# Patient Record
Sex: Female | Born: 1985 | Race: Black or African American | Hispanic: No | Marital: Single | Smoking: Never smoker
Health system: Southern US, Community
[De-identification: ages and names within clinical notes are randomized; demographics above are authoritative.]

---

## 2008-05-04 ENCOUNTER — Emergency Department (HOSPITAL_COMMUNITY): Admission: EM | Admit: 2008-05-04 | Discharge: 2008-05-04 | Payer: Self-pay | Admitting: Emergency Medicine

## 2008-10-09 ENCOUNTER — Inpatient Hospital Stay (HOSPITAL_COMMUNITY): Admission: AD | Admit: 2008-10-09 | Discharge: 2008-10-11 | Payer: Self-pay | Admitting: Obstetrics and Gynecology

## 2010-06-23 LAB — CBC
HCT: 37.2 % (ref 36.0–46.0)
HCT: 39.5 % (ref 36.0–46.0)
Hemoglobin: 13.4 g/dL (ref 12.0–15.0)
MCHC: 33.8 g/dL (ref 30.0–36.0)
MCHC: 34 g/dL (ref 30.0–36.0)
MCV: 98 fL (ref 78.0–100.0)
MCV: 98.4 fL (ref 78.0–100.0)
Platelets: 182 10*3/uL (ref 150–400)
RBC: 4.03 MIL/uL (ref 3.87–5.11)
RDW: 13.9 % (ref 11.5–15.5)
RDW: 14 % (ref 11.5–15.5)
WBC: 13.4 10*3/uL — ABNORMAL HIGH (ref 4.0–10.5)

## 2010-07-30 NOTE — Discharge Summary (Signed)
NAMEMONSERRATE, BLASCHKE            ACCOUNT NO.:  [id number]   MEDICAL RECORD NO.:  [id number]          PATIENT TYPE:  INP   LOCATION:  9128                          FACILITY:  [facility]   PHYSICIAN:  Malachi Pro. Ambrose Mantle, M.D. DATE OF BIRTH:  [birth year]   DATE OF ADMISSION:  10/09/2008  DATE OF DISCHARGE:  10/11/2008                               DISCHARGE SUMMARY   HISTORY:  This is a 25 year old black female para 0, gravida 1 at 31  weeks, EDC on October 17, 2008, by last period compatible with a 12-week  ultrasound, presented to labor and delivered approximately 2 a.m. with  contractions every 2-4 minutes.  There were painful.  Cervix was  reported by the RN, 2 cm, 70%, and in the office had been a fingertip  and 30%, demonstrated change.  The patient was admitted overnight,  really had no cervical change after admission.  On Dr. Berenda Morale exam  at 8:30 a.m., the cervix was 2 cm, 50%, vertex at a -2.  The blood group  and type was O positive, negative antibody, rubella immune, hepatitis B  surface antigen negative, HIV negative, RPR nonreactive, GC and  chlamydia negative.  First trimester screen normal.  One-hour Glucola  72.  Group B strep was positive.  The patient had no known allergies.   No abnormal GYN history, surgical history, or medical history.   MEDICATIONS:  Prenatal vitamins and iron.   On admission, she was afebrile with normal vital signs.  The heart and  lungs were normal.  The abdomen was soft.  Cervix was 2 cm, 50%, vertex  at -2.  Dr. Senaida Ores ruptured membranes with clear fluid.  The patient  was placed on penicillin for prophylaxis.  Group B strep.  At 12:30  p.m., her Pitocin was at 8 milliunits a minute.  Contractions every 2-3  minutes.  Cervix was 3-4 cm, 70-80%, vertex at a -2.  By 1:43 p.m., the  Pitocin was at 10 milliunits a minute.  Contractions every 2 minutes.  Cervix was 4 cm, 90% vertex had descended to a 0 station.  At 5:45 p.m.,  Pitocin was 11  milliunits a minute.  Contractions every 2-3 minutes.  Cervix was 10 cm and vertex at a +1.  The patient pushed well and made  slow, but steady progress.  She delivered spontaneously LOA over an  intact perineum with bilateral labial and periclitoral lacerations by  Dr. Ambrose Mantle.  A living 8 pounds 4 ounces female infant without Apgars of  9 at 1 and 9 at 5 minutes.  Placenta was intact.  The uterus was normal.  Blood loss about 400 mL both labial lacerations were bleeding and  repaired with 3-0 Vicryl.  Clitoral laceration was hemostatic and not  repaired.  Postpartum, the patient did quite well and was discharged on  the second postpartum day.  RPR was nonreactive.  Initial hemoglobin  13.4, hematocrit 39.5, white count 78,100, and platelet count 201,000.  Followup hemoglobin 12.6.   FINAL DIAGNOSES:  Intrauterine pregnancy at 39 weeks and delivered left  occipitoanterior.   OPERATION:  Spontaneous delivery, left occipitoanterior  repair of  bilateral labial lacerations.   FINAL CONDITION:  Improved.   INSTRUCTIONS:  Our discharge instruction booklet.  Percocet 5/325 and  Motrin 600 mg 20 tablets of each.  Percocet one every 4-6 hours as  needed for pain and Motrin 600 mg one every 6 hours as needed for pain.  The patient is advised to return in 6 weeks for followup examination.      Malachi Pro. Ambrose Mantle, M.D.  Electronically Signed     TFH/MEDQ  D:  10/11/2008  T:  10/12/2008  Job:  [id number]

## 2010-10-24 ENCOUNTER — Other Ambulatory Visit: Payer: Self-pay | Admitting: Occupational Medicine

## 2010-10-24 ENCOUNTER — Ambulatory Visit: Payer: Self-pay

## 2010-10-24 DIAGNOSIS — Z021 Encounter for pre-employment examination: Secondary | ICD-10-CM

## 2012-09-07 ENCOUNTER — Ambulatory Visit: Payer: BC Managed Care – PPO | Admitting: Family Medicine

## 2012-09-07 VITALS — BP 120/81 | HR 83 | Temp 98.0°F | Resp 17 | Ht 70.0 in | Wt 140.0 lb

## 2012-09-07 DIAGNOSIS — M7989 Other specified soft tissue disorders: Secondary | ICD-10-CM

## 2012-09-07 LAB — POCT CBC
Granulocyte percent: 72.5 %G (ref 37–80)
Hemoglobin: 13.7 g/dL (ref 12.2–16.2)
MCV: 97.3 fL — AB (ref 80–97)
MID (cbc): 0.5 (ref 0–0.9)
Platelet Count, POC: 290 10*3/uL (ref 142–424)
RBC: 4.42 M/uL (ref 4.04–5.48)
WBC: 7.4 10*3/uL (ref 4.6–10.2)

## 2012-09-07 MED ORDER — PREDNISONE 20 MG PO TABS: ORAL_TABLET | ORAL | Status: DC

## 2012-09-07 MED ORDER — DOXYCYCLINE HYCLATE 100 MG PO TABS: 100.0000 mg | ORAL_TABLET | Freq: Two times a day (BID) | ORAL | Status: DC

## 2012-09-07 NOTE — Progress Notes (Signed)
[facility] [address] [place] [address] [phone]  Date:  09/07/2012   Name:  Deanna Burgess   DOB:  Mar 20, 1985   MRN:  [id number]  PCP:  No primary provider on file.    Chief Complaint: Arm Injury   History of Present Illness:  Deanna Burgess is a 27 y.o. very pleasant female patient who presents with the following:  She notes a problem with her right forearm- yesterday morning she noted onset of swelling, itching and burning of her right forearm.  She was not aware of any definite insect bite. Does not think she was stung by a bee or anything else.  The swelling seems to be worse today- progressed a little bit overnight No contact with new chemicals or products recently.    No other rashes or hives. Her hand is "a little numb," but she can still feel her fingers.    Otherwise she feels well, no fever or chills, no lip/ tongue swelling, SOB or wheezing.    She is generally healthy, is on OCP.  Non- smoker, has a daughter  There are no active problems to display for this patient.   History reviewed. No pertinent past medical history.  History reviewed. No pertinent past surgical history.  History  Substance Use Topics  . Smoking status: Never Smoker   . Smokeless tobacco: Not on file  . Alcohol Use: No    Family History  Problem Relation Age of Onset  . Hypertension Mother     No Known Allergies  Medication list has been reviewed and updated.  No current outpatient prescriptions on file prior to visit.   No current facility-administered medications on file prior to visit.    Review of Systems:  As per HPI- otherwise negative.   Physical Examination: Filed Vitals:   09/07/12 1128  BP: 120/81  Pulse: 83  Temp: 98 F (36.7 C)  Resp: 17   Filed Vitals:   09/07/12 1128  Height: 5\' 10"  (1.778 m)  Weight: 140 lb (63.504 kg)   Body mass index is 20.09 kg/(m^2). Ideal Body Weight: Weight in (lb) to have BMI =  25: 173.9  GEN: WDWN, NAD, Non-toxic, A & O x 3, looks well HEENT: Atraumatic, Normocephalic. Neck supple. No masses, No LAD.  Bilateral TM wnl, oropharynx normal.  PEERL,EOMI.   No angioedema Ears and Nose: No external deformity. CV: RRR, No M/G/R. No JVD. No thrill. No extra heart sounds. PULM: CTA B, no wheezes, crackles, rhonchi. No retractions. No resp. distress. No accessory muscle use. EXTR: No c/c/e NEURO Normal gait.  PSYCH: Normally interactive. Conversant. Not depressed or anxious appearing.  Calm demeanor.  Right forearm: on the ventral and ulnar side there is a large, confluent, warm and slighlty edematous area with a small central leison.   Her wrist, hand and elbow are all negative.  The swollen area is not tender or painful- it is itchy.   No pain with passive movement of the fingers.  Normal radial pulse, normal cap refill of fingers, normal strength of bilateral UE.  She is able to feel light touch on her hand   Outlined the area with a skin marker.    Results for orders placed in visit on 09/07/12  POCT CBC      Result Value Range   WBC 7.4  4.6 - 10.2 K/uL   Lymph, poc 1.5  0.6 - 3.4   POC LYMPH PERCENT 20.7  10 -  50 %L   MID (cbc) 0.5  0 - 0.9   POC MID % 6.8  0 - 12 %M   POC Granulocyte 5.4  2 - 6.9   Granulocyte percent 72.5  37 - 80 %G   RBC 4.42  4.04 - 5.48 M/uL   Hemoglobin 13.7  12.2 - 16.2 g/dL   HCT, POC 16.1  09.6 - 47.9 %   MCV 97.3 (*) 80 - 97 fL   MCH, POC 31.0  27 - 31.2 pg   MCHC 31.9  31.8 - 35.4 g/dL   RDW, POC 04.5     Platelet Count, POC 290  142 - 424 K/uL   MPV 10.1  0 - 99.8 fL    Assessment and Plan: Swelling of arm - Plan: POCT CBC, doxycycline (VIBRA-TABS) 100 MG tablet, predniSONE (DELTASONE) 20 MG tablet  Arm swelling- consider allergic reaction vs cellulitis.  Will cover for both with doxy and prednisone. She is to follow- up closely if not better in the next 1 or 2 days.  Discussed signs and sx of compartment syndrome- of  these occur she is to seek care right away.   Note for work today.   Signed Abbe Amsterdam, MD

## 2012-09-07 NOTE — Patient Instructions (Addendum)
Use the prednisone and antibiotic as directed.  You can also use benadryl, Claritin, and benadryl and/ or steroid creams.  Let me know if you are not better in the next couple of days. If any severe pain or swelling get help right away.

## 2012-09-20 ENCOUNTER — Other Ambulatory Visit: Payer: Self-pay | Admitting: Family Medicine

## 2012-09-20 ENCOUNTER — Telehealth: Payer: Self-pay

## 2012-09-20 NOTE — Telephone Encounter (Signed)
Pt is calling to see about getting an antibotic because what she was seen for a couple of weeks ago has came back Call back number is [phone]

## 2012-09-22 NOTE — Telephone Encounter (Signed)
Arm swelling- consider allergic reaction vs cellulitis. Will cover for both with doxy and prednisone. She is to follow- up closely if not better in the next 1 or 2 days. Discussed signs and sx of compartment syndrome- of these occur she is to seek care right away.  Above from office visit. Called patient advised to return to clinic. She agrees to return to clinic.

## 2015-08-27 ENCOUNTER — Emergency Department (HOSPITAL_COMMUNITY): Payer: Managed Care, Other (non HMO)

## 2015-08-27 ENCOUNTER — Emergency Department (HOSPITAL_COMMUNITY)
Admission: EM | Admit: 2015-08-27 | Discharge: 2015-08-27 | Disposition: A | Discharge status: Home / Self Care | Payer: Managed Care, Other (non HMO) | Attending: Emergency Medicine | Admitting: Emergency Medicine

## 2015-08-27 ENCOUNTER — Encounter (HOSPITAL_COMMUNITY): Payer: Self-pay | Admitting: *Deleted

## 2015-08-27 DIAGNOSIS — R072 Precordial pain: Secondary | ICD-10-CM | POA: Diagnosis present

## 2015-08-27 DIAGNOSIS — Z79899 Other long term (current) drug therapy: Secondary | ICD-10-CM | POA: Insufficient documentation

## 2015-08-27 DIAGNOSIS — R0789 Other chest pain: Secondary | ICD-10-CM | POA: Diagnosis not present

## 2015-08-27 LAB — URINE MICROSCOPIC-ADD ON

## 2015-08-27 LAB — BASIC METABOLIC PANEL
ANION GAP: 7 (ref 5–15)
BUN: 6 mg/dL (ref 6–20)
CO2: 24 mmol/L (ref 22–32)
Calcium: 9.2 mg/dL (ref 8.9–10.3)
Chloride: 107 mmol/L (ref 101–111)
Creatinine, Ser: 0.94 mg/dL (ref 0.44–1.00)
GFR calc Af Amer: >60 mL/min (ref >60)
GFR calc non Af Amer: >60 mL/min (ref >60)
GLUCOSE: 89 mg/dL (ref 65–99)
POTASSIUM: 3.5 mmol/L (ref 3.5–5.1)
Sodium: 138 mmol/L (ref 135–145)

## 2015-08-27 LAB — URINALYSIS, ROUTINE W REFLEX MICROSCOPIC
Bilirubin Urine: NEGATIVE
Glucose, UA: NEGATIVE mg/dL
Hgb urine dipstick: NEGATIVE
KETONES UR: NEGATIVE mg/dL
NITRITE: NEGATIVE
PH: 6 (ref 5.0–8.0)
Protein, ur: NEGATIVE mg/dL
SPECIFIC GRAVITY, URINE: 1.019 (ref 1.005–1.030)

## 2015-08-27 LAB — CBC
HEMATOCRIT: 41.3 % (ref 36.0–46.0)
HEMOGLOBIN: 13.3 g/dL (ref 12.0–15.0)
MCH: 29.9 pg (ref 26.0–34.0)
MCHC: 32.2 g/dL (ref 30.0–36.0)
MCV: 92.8 fL (ref 78.0–100.0)
Platelets: 263 10*3/uL (ref 150–400)
RBC: 4.45 MIL/uL (ref 3.87–5.11)
RDW: 13.3 % (ref 11.5–15.5)
WBC: 6 10*3/uL (ref 4.0–10.5)

## 2015-08-27 LAB — D-DIMER, QUANTITATIVE: D-Dimer, Quant: 0.54 ug/mL-FEU — ABNORMAL HIGH (ref 0.00–0.50)

## 2015-08-27 LAB — PREGNANCY, URINE: Preg Test, Ur: NEGATIVE

## 2015-08-27 LAB — I-STAT TROPONIN, ED: Troponin i, poc: 0 ng/mL (ref 0.00–0.08)

## 2015-08-27 MED ORDER — IBUPROFEN 400 MG PO TABS
400.0000 mg | ORAL_TABLET | Freq: Once | ORAL | Status: AC
  Administered 2015-08-27: 400 mg via ORAL
  Filled 2015-08-27: qty 1

## 2015-08-27 MED ORDER — ACETAMINOPHEN 500 MG PO TABS
1000.0000 mg | ORAL_TABLET | Freq: Once | ORAL | Status: AC
  Administered 2015-08-27: 1000 mg via ORAL
  Filled 2015-08-27: qty 2

## 2015-08-27 MED ORDER — METHOCARBAMOL 500 MG PO TABS: 1000.0000 mg | ORAL_TABLET | Freq: Four times a day (QID) | ORAL | Status: DC | PRN

## 2015-08-27 MED ORDER — IOPAMIDOL (ISOVUE-370) INJECTION 76%
100.0000 mL | Freq: Once | INTRAVENOUS | Status: AC | PRN
  Administered 2015-08-27: 100 mL via INTRAVENOUS

## 2015-08-27 MED ORDER — NAPROXEN 250 MG PO TABS: 250.0000 mg | ORAL_TABLET | Freq: Two times a day (BID) | ORAL | Status: DC | PRN

## 2015-08-27 MED ORDER — IOPAMIDOL (ISOVUE-370) INJECTION 76%
INTRAVENOUS | Status: AC
  Filled 2015-08-27: qty 100

## 2015-08-27 NOTE — ED Notes (Signed)
Pt ambulates independently and with steady gait at time of discharge. Discharge instructions and follow up information reviewed with patient. No other questions or concerns voiced at this time.  

## 2015-08-27 NOTE — Discharge Instructions (Signed)
Take the prescriptions as directed.  Apply moist heat or ice to the area(s) of discomfort, for 15 minutes at a time, several times per day for the next few days.  Do not fall asleep on a heating or ice pack.  Call your regular medical doctor today to schedule a follow up appointment this week.  Return to the Emergency Department immediately if worsening. ° °

## 2015-08-27 NOTE — ED Provider Notes (Signed)
CSN: [id number]     Arrival date & time 08/27/15  0841 History   First MD Initiated Contact with Patient 08/27/15 0915     Chief Complaint  Patient presents with  . Chest Pain      HPI Pt was seen at 0915. Per pt, c/o gradual onset and persistence of constant chest "pain" that began last night. Pt states the CP started on her upper mid-sternal area, then moved to her right chest, then moved back to her mid-sternal area. States she has had similar symptoms intermittently "for years," but did not seek medical attention. Pt did not take any meds to treat her symptoms. Denies palpitations, no SOB/cough, no abd pain, no N/V/D, no back pain, no neck pain, no rash, no fevers.    History reviewed. No pertinent past medical history.  Family History  Problem Relation Age of Onset  . Hypertension Mother    Social History  Substance Use Topics  . Smoking status: Never Smoker   . Smokeless tobacco: None  . Alcohol Use: Yes     Comment: occ    Review of Systems ROS: Statement: All systems negative except as marked or noted in the HPI; Constitutional: Negative for fever and chills. ; ; Eyes: Negative for eye pain, redness and discharge. ; ; ENMT: Negative for ear pain, hoarseness, nasal congestion, sinus pressure and sore throat. ; ; Cardiovascular: Negative for palpitations, diaphoresis, dyspnea and peripheral edema. ; ; Respiratory: Negative for cough, wheezing and stridor. ; ; Gastrointestinal: Negative for nausea, vomiting, diarrhea, abdominal pain, blood in stool, hematemesis, jaundice and rectal bleeding. . ; ; Genitourinary: Negative for dysuria, flank pain and hematuria. ; ; Musculoskeletal: +chest wall pain. Negative for back pain and neck pain. Negative for swelling and trauma.; ; Skin: Negative for pruritus, rash, abrasions, blisters, bruising and skin lesion.; ; Neuro: Negative for headache, lightheadedness and neck stiffness. Negative for weakness, altered level of consciousness, altered  mental status, extremity weakness, paresthesias, involuntary movement, seizure and syncope.      Allergies  Review of patient's allergies indicates no known allergies.  Home Medications   Prior to Admission medications   Medication Sig Start Date End Date Taking? Authorizing Provider  doxycycline (VIBRA-TABS) 100 MG tablet Take 1 tablet (100 mg total) by mouth 2 (two) times daily. 09/07/12   Pearline Cables, MD  predniSONE (DELTASONE) 20 MG tablet Take 2 pills a day for 2 days, then 1 pill a day for 2 days 09/07/12   Pearline Cables, MD   BP 125/89 mmHg  Pulse 72  Temp(Src) 99 F (37.2 C) (Oral)  Resp 16  Ht  (1.753 m)  Wt 158 lb (71.668 kg)  BMI 23.32 kg/m2  SpO2 100%  LMP 08/24/2015 Physical Exam  0920: Physical examination:  Nursing notes reviewed; Vital signs and O2 SAT reviewed;  Constitutional: Well developed, Well nourished, Well hydrated, In no acute distress; Head:  Normocephalic, atraumatic; Eyes: EOMI, PERRL, No scleral icterus; ENMT: Mouth and pharynx normal, Mucous membranes moist; Neck: Supple, Full range of motion, No lymphadenopathy; Cardiovascular: Regular rate and rhythm, No murmur, rub, or gallop; Respiratory: Breath sounds clear & equal bilaterally, No rales, rhonchi, wheezes.  Speaking full sentences with ease, Normal respiratory effort/excursion; Chest: +right upper and mid-parasternal areas tender to palp. No rash, no deformity, no soft tissue crepitus. Movement normal; Abdomen: Soft, Nontender, Nondistended, Normal bowel sounds; Genitourinary: No CVA tenderness; Extremities: Pulses normal, No tenderness, No edema, No calf edema or asymmetry.; Neuro: AA&Ox3,  Major CN grossly intact.  Speech clear. No gross focal motor or sensory deficits in extremities.; Skin: Color normal, Warm, Dry.   ED Course  Procedures (including critical care time) Labs Review  Imaging Review  I have personally reviewed and evaluated these images and lab results as part of my  medical decision-making.   EKG Interpretation   Date/Time:  Monday August 27 2015 08:53:15 EDT Ventricular Rate:  69 PR Interval:  116 QRS Duration: 90 QT Interval:  388 QTC Calculation: 415 R Axis:   86 Text Interpretation:  Normal sinus rhythm Nonspecific T wave abnormality  Baseline wander No old tracing to compare Confirmed by [facility] HaysMCMANUS  MD,  Nicholos JohnsKATHLEEN [phone]([id number]) on 08/27/2015 9:28:09 AM      MDM  MDM Reviewed: previous chart, nursing note and vitals Reviewed previous: labs Interpretation: labs, ECG and x-ray   Results for orders placed or performed during the hospital encounter of 08/27/15  Basic metabolic panel  Result Value Ref Range   Sodium 138 135 - 145 mmol/L   Potassium 3.5 3.5 - 5.1 mmol/L   Chloride 107 101 - 111 mmol/L   CO2 24 22 - 32 mmol/L   Glucose, Bld 89 65 - 99 mg/dL   BUN 6 6 - 20 mg/dL   Creatinine, Ser 6.040.94 0.44 - 1.00 mg/dL   Calcium 9.2 8.9 - 54.010.3 mg/dL   GFR calc non Af Amer >60 >60 mL/min   GFR calc Af Amer >60 >60 mL/min   Anion gap 7 5 - 15  CBC  Result Value Ref Range   WBC 6.0 4.0 - 10.5 K/uL   RBC 4.45 3.87 - 5.11 MIL/uL   Hemoglobin 13.3 12.0 - 15.0 g/dL   HCT 98.141.3 19.136.0 - 47.846.0 %   MCV 92.8 78.0 - 100.0 fL   MCH 29.9 26.0 - 34.0 pg   MCHC 32.2 30.0 - 36.0 g/dL   RDW 29.513.3 62.111.5 - 30.815.5 %   Platelets 263 150 - 400 K/uL  D-dimer, quantitative  Result Value Ref Range   D-Dimer, Quant 0.54 (H) 0.00 - 0.50 ug/mL-FEU  Urinalysis, Routine w reflex microscopic  Result Value Ref Range   Color, Urine YELLOW YELLOW   APPearance CLOUDY (A) CLEAR   Specific Gravity, Urine 1.019 1.005 - 1.030   pH 6.0 5.0 - 8.0   Glucose, UA NEGATIVE NEGATIVE mg/dL   Hgb urine dipstick NEGATIVE NEGATIVE   Bilirubin Urine NEGATIVE NEGATIVE   Ketones, ur NEGATIVE NEGATIVE mg/dL   Protein, ur NEGATIVE NEGATIVE mg/dL   Nitrite NEGATIVE NEGATIVE   Leukocytes, UA MODERATE (A) NEGATIVE  Pregnancy, urine  Result Value Ref Range   Preg Test, Ur NEGATIVE NEGATIVE   Urine microscopic-add on  Result Value Ref Range   Squamous Epithelial / LPF 6-30 (A) NONE SEEN   WBC, UA 6-30 0 - 5 WBC/hpf   RBC / HPF 0-5 0 - 5 RBC/hpf   Bacteria, UA FEW (A) NONE SEEN   Urine-Other MUCOUS PRESENT   I-stat troponin, ED  Result Value Ref Range   Troponin i, poc 0.00 0.00 - 0.08 ng/mL   Comment 3           Dg Chest 2 View 08/27/2015  CLINICAL DATA:  Chest pain with left arm tingling for 1 day EXAM: CHEST  2 VIEW COMPARISON:  October 24, 2010 FINDINGS: There is mild scarring in the left upper lobe. The lungs elsewhere clear. Heart size and pulmonary vascularity are normal. No adenopathy. No bone lesions. IMPRESSION: Mild  scarring left upper lobe.  No edema or consolidation. Electronically Signed   By: Bretta Bang III M.D.   On: 08/27/2015 09:40   Ct Angio Chest Pe W/cm &/or Wo Cm 08/27/2015  CLINICAL DATA:  30 year old female with mid chest pain radiating to the right breast. EXAM: CT ANGIOGRAPHY CHEST WITH CONTRAST TECHNIQUE: Multidetector CT imaging of the chest was performed using the standard protocol during bolus administration of intravenous contrast. Multiplanar CT image reconstructions and MIPs were obtained to evaluate the vascular anatomy. CONTRAST:  80 cc Isovue 370 COMPARISON:  Radiograph dated 08/27/2015 FINDINGS: Evaluation of this exam is limited due to respiratory motion artifact. The lungs are clear. There is no pleural effusion or pneumothorax. The central airways are patent. The thoracic aorta appears unremarkable. The origins of the great vessels of the aortic arch appear patent. No CT evidence of pulmonary embolism. There is no hilar or mediastinal adenopathy. Residual thymic tissue noted in the anterior mediastinum. There is no cardiomegaly or pericardial effusion. Esophagus is grossly unremarkable. There is no axillary adenopathy. The chest wall soft tissues appear unremarkable. The osseous structures are intact. The visualized upper abdomen is  unremarkable. Review of the MIP images confirms the above findings. IMPRESSION: No acute intrathoracic pathology. No CT evidence of pulmonary embolism. Electronically Signed   By: Elgie Collard M.D.   On: 08/27/2015 11:42    1235:  Workup reassuring. Doubt PE as cause for symptoms with normal CT-A chest.  Doubt ACS as cause for symptoms with normal troponin after 2 days of constant symptoms.   Udip contaminated. Tx symptomatically, f/u PMD. Dx and testing d/w pt.  Questions answered.  Verb understanding, agreeable to d/c home with outpt f/u.   Samuel Jester, DO 08/29/15 2247

## 2015-08-27 NOTE — ED Notes (Signed)
Pt states central chest pain and L arm tingling since yesterday evening.  Denies nausea or sob.

## 2015-10-01 ENCOUNTER — Ambulatory Visit (INDEPENDENT_AMBULATORY_CARE_PROVIDER_SITE_OTHER): Payer: Managed Care, Other (non HMO) | Admitting: Urgent Care

## 2015-10-01 VITALS — BP 120/76 | HR 60 | Temp 97.2°F | Resp 16 | Ht 69.0 in | Wt 160.0 lb

## 2015-10-01 DIAGNOSIS — H578 Other specified disorders of eye and adnexa: Secondary | ICD-10-CM

## 2015-10-01 DIAGNOSIS — H109 Unspecified conjunctivitis: Secondary | ICD-10-CM | POA: Diagnosis not present

## 2015-10-01 DIAGNOSIS — H5789 Other specified disorders of eye and adnexa: Secondary | ICD-10-CM

## 2015-10-01 MED ORDER — POLYMYXIN B-TRIMETHOPRIM 10000-0.1 UNIT/ML-% OP SOLN: 1.0000 [drp] | OPHTHALMIC | Status: DC

## 2015-10-01 MED ORDER — AZELASTINE HCL 0.05 % OP SOLN: 1.0000 [drp] | Freq: Two times a day (BID) | OPHTHALMIC | Status: DC

## 2015-10-01 NOTE — Patient Instructions (Addendum)
   Allergic Conjunctivitis Allergic conjunctivitis is inflammation of the clear membrane that covers the white part of your eye and the inner surface of your eyelid (conjunctiva), and it is caused by allergies. The blood vessels in the conjunctiva become inflamed, and this causes the eye to become red or pink, and it often causes itchiness in the eye. Allergic conjunctivitis cannot be spread by one person to another person (noncontagious). CAUSES This condition is caused by an allergic reaction. Common causes of an allergic reaction (allergens) include:  Dust.  Pollen.  Mold.  Animal dander or secretions. RISK FACTORS This condition is more likely to develop if you are exposed to high levels of allergens that cause the allergic reaction. This might include being outdoors when air pollen levels are high or being around animals that you are allergic to. SYMPTOMS Symptoms of this condition may include:  Eye redness.  Tearing of the eyes.  Watery eyes.  Itchy eyes.  Burning feeling in the eyes.  Clear drainage from the eyes.  Swollen eyelids. DIAGNOSIS This condition may be diagnosed by medical history and physical exam. If you have drainage from your eyes, it may be tested to rule out other causes of conjunctivitis. TREATMENT Treatment for this condition often includes medicines. These may be eye drops, ointments, or oral medicines. They may be prescription medicines or over-the-counter medicines. HOME CARE INSTRUCTIONS  Take or apply medicines only as directed by your health care provider.  Do not touch or rub your eyes.  Do not wear contact lenses until the inflammation is gone. Wear glasses instead.  Do not wear eye makeup until the inflammation is gone.  Apply a cool, clean washcloth to your eye for 10-20 minutes, 3-4 times a day.  Try to avoid whatever allergen is causing the allergic reaction. SEEK MEDICAL CARE IF:  Your symptoms get worse.  You have pus  draining from your eye.  You have new symptoms.  You have a fever.   This information is not intended to replace advice given to you by your health care provider. Make sure you discuss any questions you have with your health care provider.   Document Released: 05/24/2002 Document Revised: 03/24/2014 Document Reviewed: 12/13/2013 Elsevier Interactive Patient Education 2016 Else[employer]   IF you received an x-ray today, you will receive an invoice from [facility]. Please contact [facility] at [phone] with questions or concerns regarding your invoice.   IF you received labwork today, you will receive an invoice from [facility]. Please contact [facility] at [phone] with questions or concerns regarding your invoice.   Our billing staff will not be able to assist you with questions regarding bills from these companies.  You will be contacted with the lab results as soon as they are available. The fastest way to get your results is to activate your My Chart account. Instructions are located on the last page of this paperwork. If you have not heard from us regarding the results in 2 weeks, please contact this office.     

## 2015-10-01 NOTE — Progress Notes (Signed)
    MRN: [id number] DOB: 1985/08/09  Subjective:   Deanna Burgess is a 30 y.o. female presenting for chief complaint of Eye Drainage  Reports 1 day history of left eye redness, crusted around eye lids, itching, mildly watery. Eye redness is improving but still there. Used Visine with some relief. Denies fever, photosensitivity, eye pain, eye trauma, foreign body sensation, sinus congestion, ear pain, cough, sore throat, rashes. 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 Denies history of seasonal allergies.  Deanna Burgess has a current medication list which includes the following prescription(s): etonogestrel. Also has No Known Allergies.  Deanna Burgess  has no past medical history on file. Also  has no past surgical history on file.  Objective:   Vitals: BP 120/76 mmHg  Pulse 60  Temp(Src) 97.2 F (36.2 C) (Oral)  Resp 16  Ht 5\' 9"  (1.753 m)  Wt 160 lb (72.576 kg)  BMI 23.62 kg/m2  SpO2 100%  LMP 09/08/2015  Physical Exam  Constitutional: She is oriented to person, place, and time. She appears well-developed and well-nourished.  HENT:  TM's intact bilaterally, no effusions or erythema. Nasal turbinates pink and moist, nasal passages patent. No sinus tenderness. Oropharynx clear, mucous membranes moist, dentition in good repair.  Eyes: EOM are normal. Pupils are equal, round, and reactive to light. Right eye exhibits no discharge. Left eye exhibits discharge (mildly injected conjunctiva with clear discharge). No scleral icterus.  Neck: Normal range of motion. Neck supple.  Cardiovascular: Normal rate.   Pulmonary/Chest: Effort normal.  Lymphadenopathy:    She has no cervical adenopathy.  Neurological: She is alert and oriented to person, place, and time.  Skin: Skin is warm and dry.   Assessment and Plan :   1. Conjunctivitis of left eye 2. Redness of eye, left - Unable to perform eye exam with Wood's lamp due to not having eye saline. She will use Optivar to address  allergies, if no improvement by tomorrow, start Polytrim to address bacterial source, rtc otherwise.  Wallis BambergMario Ngai Parcell, PA-C [facility] [phone] 10/01/2015 10:33 AM

## 2017-09-12 IMAGING — CT CT ANGIO CHEST
2 of 6 series · 19 of 36 positions shown · IV contrast (Omni 300)
Comparison: Radiograph dated 08/27/2015

CLINICAL DATA: 29-year-old female with mid chest pain radiating to
the right breast.

EXAM:
CT ANGIOGRAPHY CHEST WITH CONTRAST
TECHNIQUE: Multidetector CT imaging of the chest was performed using the
standard protocol during bolus administration of intravenous
contrast. Multiplanar CT image reconstructions and MIPs were
obtained to evaluate the vascular anatomy.
CONTRAST:  80 cc Isovue 370

[Series 6: pe thins · axial · 0.48mm/px · z∈[+98,+363]mm · 18 of 587 slices shown]
[im 28/587  lung]
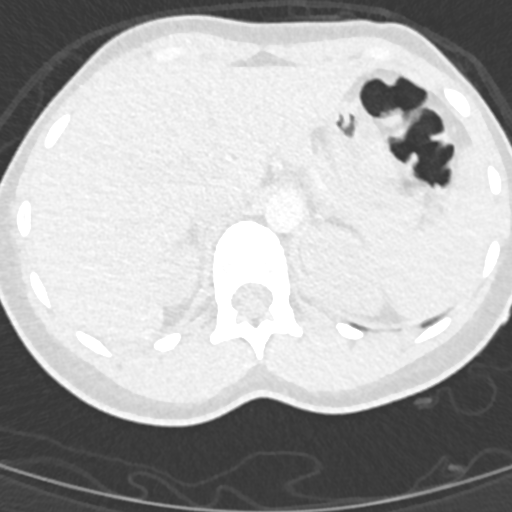
[im 56/587  mediastinal]
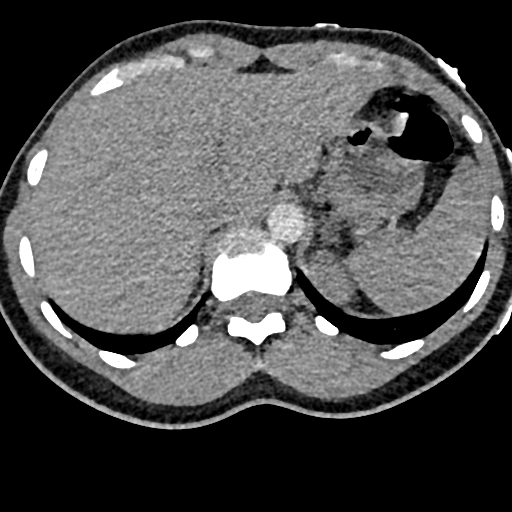
[im 84/587  lung]
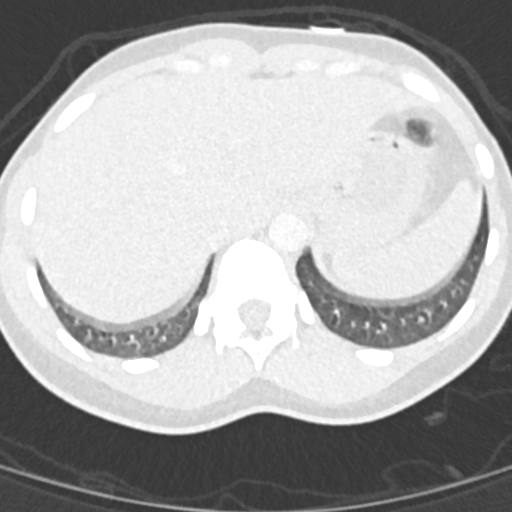
[im 112/587  mediastinal]
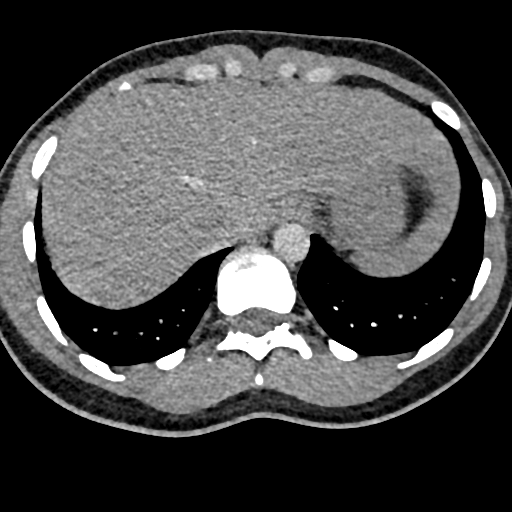
[im 168/587  lung]
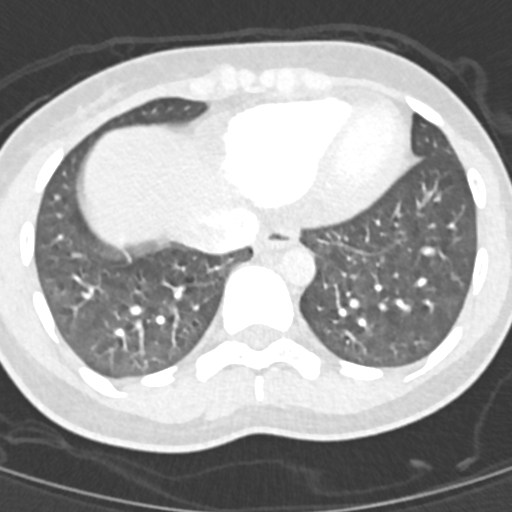
[im 196/587  mediastinal]
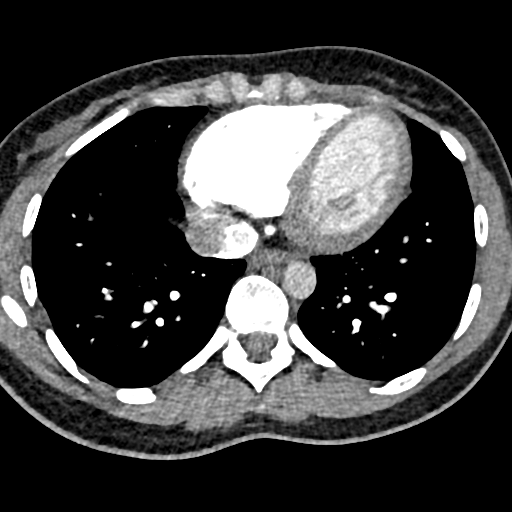
[im 224/587  lung]
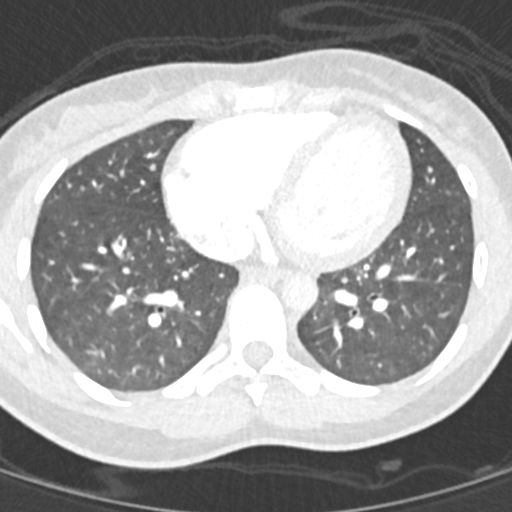
[im 252/587  mediastinal]
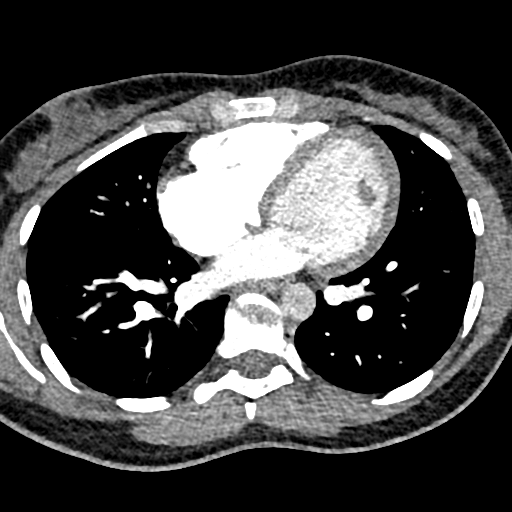
[im 280/587  lung]
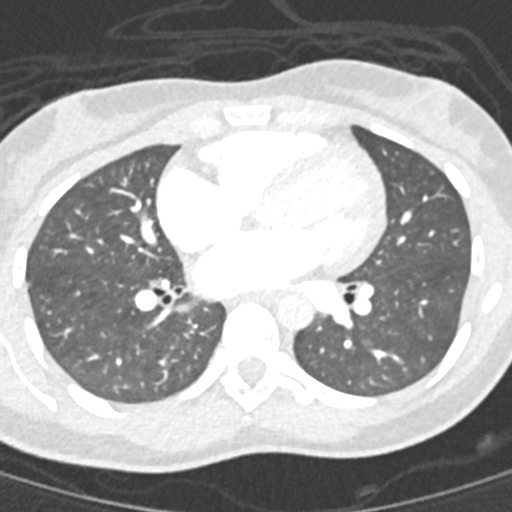
[im 307/587  mediastinal]
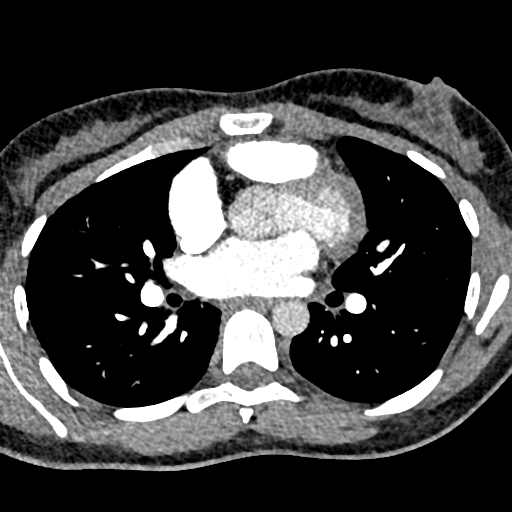
[im 335/587  lung]
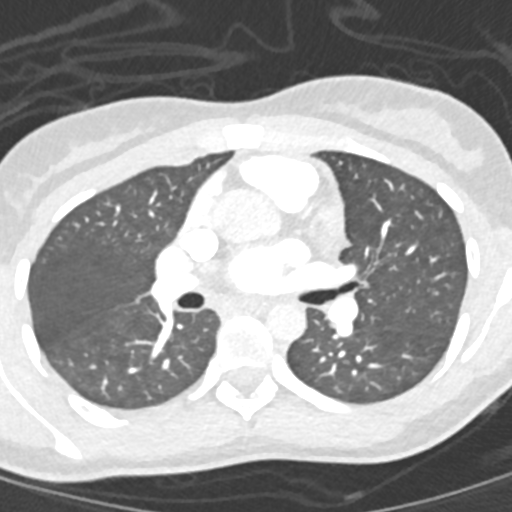
[im 363/587  mediastinal]
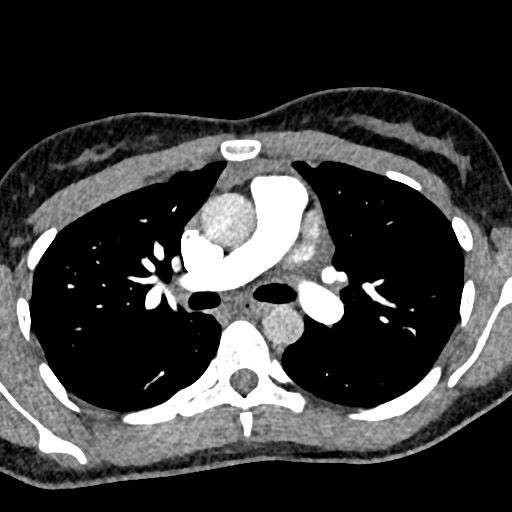
[im 391/587  lung]
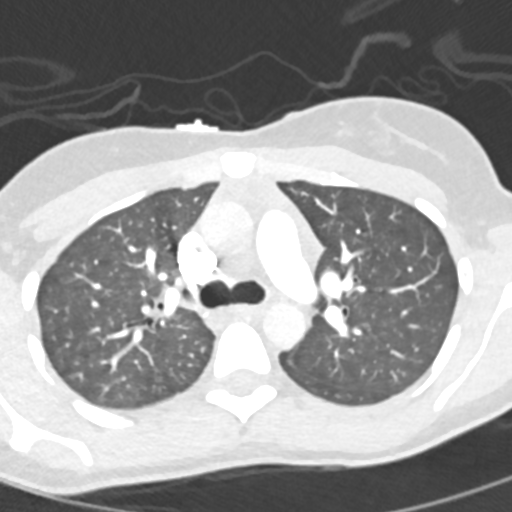
[im 447/587  mediastinal]
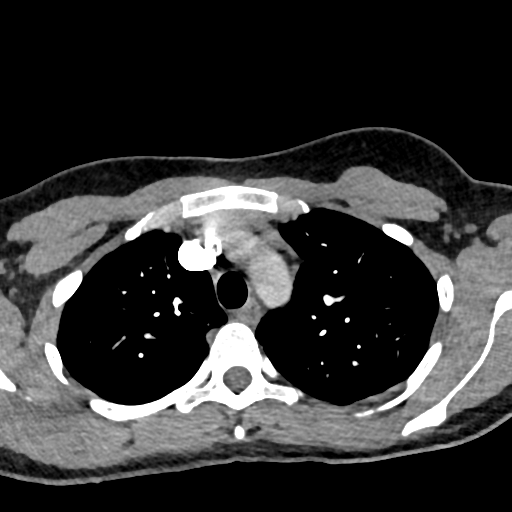
[im 475/587  lung]
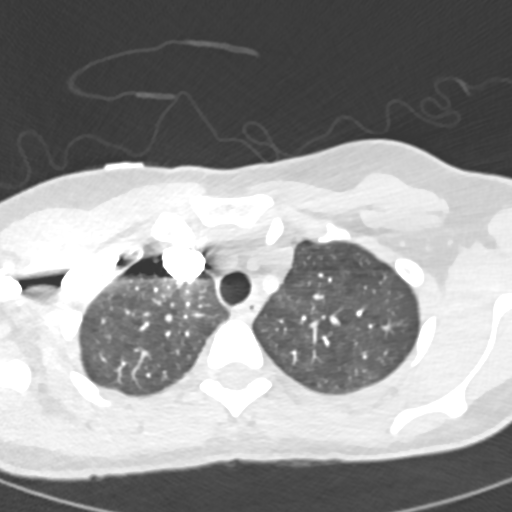
[im 503/587  mediastinal]
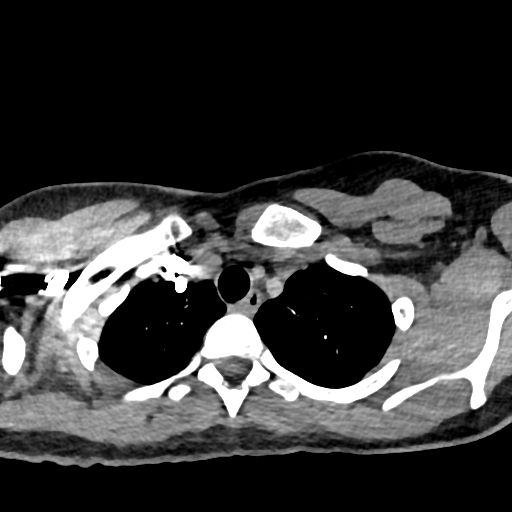
[im 531/587  lung]
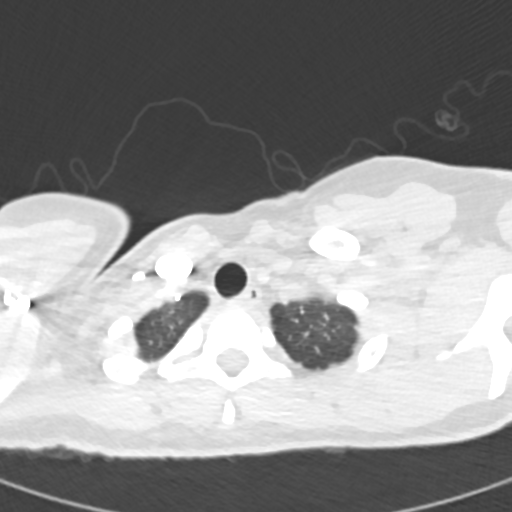
[im 559/587  mediastinal]
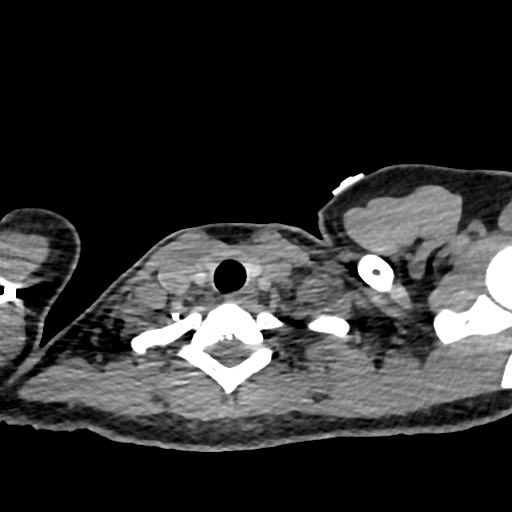

[Series 7: pe 2mm cor · coronal · 0.52mm/px · 1 of 92 slices shown]
[im 46/92  mediastinal]
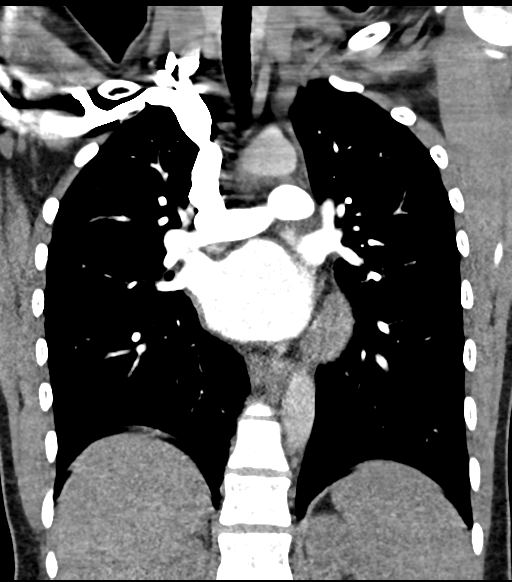

[19 of 36 positions shown; findings below may reference images not displayed]

FINDINGS: Evaluation of this exam is limited due to respiratory motion
artifact.

The lungs are clear. There is no pleural effusion or pneumothorax.
The central airways are patent.

The thoracic aorta appears unremarkable. The origins of the great
vessels of the aortic arch appear patent. No CT evidence of
pulmonary embolism. There is no hilar or mediastinal adenopathy.
Residual thymic tissue noted in the anterior mediastinum. There is
no cardiomegaly or pericardial effusion. Esophagus is grossly
unremarkable.

There is no axillary adenopathy. The chest wall soft tissues appear
unremarkable. The osseous structures are intact.

The visualized upper abdomen is unremarkable.

Review of the MIP images confirms the above findings.
IMPRESSION: No acute intrathoracic pathology. No CT evidence of pulmonary
embolism.

## 2018-04-21 ENCOUNTER — Encounter: Payer: Self-pay | Admitting: Family Medicine

## 2018-04-21 ENCOUNTER — Ambulatory Visit (INDEPENDENT_AMBULATORY_CARE_PROVIDER_SITE_OTHER): Payer: PRIVATE HEALTH INSURANCE | Admitting: Family Medicine

## 2018-04-21 VITALS — BP 140/74 | HR 69 | Temp 98.5°F | Resp 17 | Ht 69.0 in | Wt 150.0 lb

## 2018-04-21 DIAGNOSIS — R112 Nausea with vomiting, unspecified: Secondary | ICD-10-CM | POA: Diagnosis not present

## 2018-04-21 DIAGNOSIS — R05 Cough: Secondary | ICD-10-CM | POA: Diagnosis not present

## 2018-04-21 DIAGNOSIS — R059 Cough, unspecified: Secondary | ICD-10-CM

## 2018-04-21 DIAGNOSIS — B349 Viral infection, unspecified: Secondary | ICD-10-CM | POA: Diagnosis not present

## 2018-04-21 MED ORDER — ONDANSETRON 4 MG PO TBDP: 4.0000 mg | ORAL_TABLET | Freq: Three times a day (TID) | ORAL | 0 refills | Status: AC | PRN

## 2018-04-21 NOTE — Progress Notes (Signed)
Subjective:    Patient ID: Deanna Burgess, female    DOB: 10/25/1985, 33 y.o.   MRN: [id number]  HPI Deanna Burgess is a 33 y.o. female Presents today for: Chief Complaint  Patient presents with  . Emesis  . Fatigue   Started 6 nights ago - chills, subjective fever. Unknown reading. Rested and took theraflu.  Temp improved overnight. Felt better 4 days ago. Tried to eat, but had vomiting x 2. Slight diarrhea/loose stool on Sunday.  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 Tolerated fluids, few crackers. Water/ginger ale.  Improved 2 days ago. Still eating crackers, fluids. Rested again yesterday. Felt better yesterday.  More vomiting today after trying to eat more food - oatmeal, orange juice, and salad for lunch. Went to work, then started back with vomiting.  - 5-6 times. Some abd pain. Mid abdominal pain. Improves after vomiting.  Some cough/congestion past 5 days. Persistent No recent fever. No abdominal surgeries.  Daughter with cough, no other contacts with GI symptoms.   On Sprintec for contraception, no missed doses.  LMP normal 04/07/18.   Tx: equate cough and congestion, other "DM" product.   No flu vaccine this year.   There are no active problems to display for this patient.  No past medical history on file. No past surgical history on file. No Known Allergies Prior to Admission medications   Medication Sig Start Date End Date Taking? Authorizing Provider  norgestimate-ethinyl estradiol (SPRINTEC 28) 0.25-35 MG-MCG tablet Take 1 tablet by mouth daily.   Yes [provider]   Social History   Socioeconomic History  . Marital status: Single    Spouse name: Not on file  . Number of children: Not on file  . Years of education: Not on file  . Highest education level: Not on file  Occupational History  . Not on file  Social Needs  . Financial resource strain: Not on file  . Food insecurity:    Worry: Not on file   Inability: Not on file  . Transportation needs:    Medical: Not on file    Non-medical: Not on file  Tobacco Use  . Smoking status: Never Smoker  . Smokeless tobacco: Never Used  Substance and Sexual Activity  . Alcohol use: Yes    Comment: occ  . Drug use: No  . Sexual activity: Yes    Birth control/protection: Pill  Lifestyle  . Physical activity:    Days per week: Not on file    Minutes per session: Not on file  . Stress: Not on file  Relationships  . Social connections:    Talks on phone: Not on file    Gets together: Not on file    Attends religious service: Not on file    Active member of club or organization: Not on file    Attends meetings of clubs or organizations: Not on file    Relationship status: Not on file  . Intimate partner violence:    Fear of current or ex partner: Not on file    Emotionally abused: Not on file    Physically abused: Not on file    Forced sexual activity: Not on file  Other Topics Concern  . Not on file  Social History Narrative  . Not on file    Review of Systems  Gastrointestinal: Positive for abdominal pain, diarrhea (loose stools initially. ), nausea and vomiting.  Genitourinary: Negative for difficulty urinating, dysuria,  frequency, hematuria and urgency.       Objective:   Physical Exam Vitals signs reviewed.  Constitutional:      Appearance: She is well-developed.  HENT:     Head: Normocephalic and atraumatic.     Mouth/Throat:     Mouth: Mucous membranes are moist.     Pharynx: Oropharynx is clear.  Eyes:     Conjunctiva/sclera: Conjunctivae normal.     Pupils: Pupils are equal, round, and reactive to light.  Neck:     Vascular: No carotid bruit.  Cardiovascular:     Rate and Rhythm: Normal rate and regular rhythm.     Heart sounds: Normal heart sounds.  Pulmonary:     Effort: Pulmonary effort is normal.     Breath sounds: Normal breath sounds.  Abdominal:     General: Bowel sounds are increased.      Palpations: Abdomen is soft. There is no pulsatile mass.     Tenderness: There is no abdominal tenderness. There is no right CVA tenderness, left CVA tenderness, guarding or rebound.  Skin:    General: Skin is warm and dry.  Neurological:     Mental Status: She is alert and oriented to person, place, and time.  Psychiatric:        Behavior: Behavior normal.    Vitals:   04/21/18 1440  BP: 140/74  Pulse: 69  Resp: 17  Temp: 98.5 F (36.9 C)  TempSrc: Oral  SpO2: 98%  Weight: 150 lb (68 kg)  Height: 5\' 9"  (1.753 m)      Assessment & Plan:   Deanna Burgess is a 32 y.o. female Viral illness  Cough  Nausea and vomiting, intractability of vomiting not specified, unspecified vomiting type - Plan: ondansetron (ZOFRAN ODT) 4 MG disintegrating tablet  Suspected viral illness, recurrence of nausea/vomiting may have been too much food too soon.  Overall reassuring exam at present.  Appears well-hydrated.  Deferred IV fluids.  -Zofran if needed for nausea/vomiting, or radiation therapy discussed with slow resumption of normal diet.  -Mucinex as needed for cough.  -RTC precautions discussed.  Meds ordered this encounter  Medications  . ondansetron (ZOFRAN ODT) 4 MG disintegrating tablet    Sig: Take 1 tablet (4 mg total) by mouth every 8 (eight) hours as needed for nausea or vomiting.    Dispense:  10 tablet    Refill:  0   Patient Instructions    Current symptoms likely due to a virus, and vomiting today may have been the too much food, too soon.  Fluids are most important, bland foods such as soup with crackers okay initially and slowly increase as tolerated.  Zofran if needed for nausea or vomiting.  If symptoms are not improving in the next 3 to 4 days, or any worsening symptoms such as abdominal pain or fevers, return for recheck here or emergency room.  mucinex or mucinex DM as needed for cough.   Return to the clinic or go to the nearest emergency room if any of your  symptoms worsen or new symptoms occur.   Gastroenteritis:  Diarrhea Infections caused by germs (bacterial) or a virus commonly cause diarrhea. Your caregiver has determined that with time, rest and fluids, the diarrhea should improve. In general, eat normally while drinking more water than usual. Although water may prevent dehydration, it does not contain salt and minerals (electrolytes). Broths, weak tea without caffeine and oral rehydration solutions (ORS) replace fluids and electrolytes. Small amounts of fluids  should be taken frequently. Large amounts at one time may not be tolerated. Plain water may be harmful in infants and the elderly. Oral rehydrating solutions (ORS) are available at pharmacies and grocery stores. ORS replace water and important electrolytes in proper proportions. Sports drinks are not as effective as ORS and may be harmful due to sugars worsening diarrhea.  ORS is especially recommended for use in children with diarrhea. As a general guideline for children, replace any new fluid losses from diarrhea and/or vomiting with ORS as follows:   If your child weighs 22 pounds or under (10 kg or less), give 60-120 mL ( -  cup or 2 - 4 ounces) of ORS for each episode of diarrheal stool or vomiting episode.   If your child weighs more than 22 pounds (more than 10 kgs), give 120-240 mL ( - 1 cup or 4 - 8 ounces) of ORS for each diarrheal stool or episode of vomiting.   While correcting for dehydration, children should eat normally. However, foods high in sugar should be avoided because this may worsen diarrhea. Large amounts of carbonated soft drinks, juice, gelatin desserts and other highly sugared drinks should be avoided.   After correction of dehydration, other liquids that are appealing to the child may be added. Children should drink small amounts of fluids frequently and fluids should be increased as tolerated. Children should drink enough fluids to keep urine clear or pale  yellow.   Adults should eat normally while drinking more fluids than usual. Drink small amounts of fluids frequently and increase as tolerated. Drink enough fluids to keep urine clear or pale yellow. Broths, weak decaffeinated tea, lemon lime soft drinks (allowed to go flat) and ORS replace fluids and electrolytes.   Avoid:   Carbonated drinks.   Juice.   Extremely hot or cold fluids.   Caffeine drinks.   Fatty, greasy foods.   Alcohol.   Tobacco.   Too much intake of anything at one time.   Gelatin desserts.   Probiotics are active cultures of beneficial bacteria. They may lessen the amount and number of diarrheal stools in adults. Probiotics can be found in yogurt with active cultures and in supplements.   Wash hands well to avoid spreading bacteria and virus.   Anti-diarrheal medications are not recommended for infants and children.   Only take over-the-counter or prescription medicines for pain, discomfort or fever as directed by your caregiver. Do not give aspirin to children because it may cause Reye's Syndrome.   For adults, ask your caregiver if you should continue all prescribed and over-the-counter medicines.   If your caregiver has given you a follow-up appointment, it is very important to keep that appointment. Not keeping the appointment could result in a chronic or permanent injury, and disability. If there is any problem keeping the appointment, you must call back to this facility for assistance.  SEEK IMMEDIATE MEDICAL CARE IF:   You or your child is unable to keep fluids down or other symptoms or problems become worse in spite of treatment.   Vomiting or diarrhea develops and becomes persistent.   There is vomiting of blood or bile (green material).   There is blood in the stool or the stools are black and tarry.   There is no urine output in 6-8 hours or there is only a small amount of very dark urine.   Abdominal pain develops, increases or localizes.    You have a fever.   Your baby is  older than 3 months with a rectal temperature of 102 F (38.9 C) or higher.   Your baby is 403 months old or younger with a rectal temperature of 100.4 F (38 C) or higher.   You or your child develops excessive weakness, dizziness, fainting or extreme thirst.   You or your child develops a rash, stiff neck, severe headache or become irritable or sleepy and difficult to awaken.  MAKE SURE YOU:   Understand these instructions.   Will watch your condition.   Will get help right away if you are not doing well or get worse.  Document Released: 02/21/2002 Document Revised: 02/20/2011 Document Reviewed: 01/08/2009 [facility] Patient Information 2012 [place], [place].  Nausea and Vomiting Nausea is a sick feeling that often comes before throwing up (vomiting). Vomiting is a reflex where stomach contents come out of your mouth. Vomiting can cause severe loss of body fluids (dehydration). Children and elderly adults can become dehydrated quickly, especially if they also have diarrhea. Nausea and vomiting are symptoms of a condition or disease. It is important to find the cause of your symptoms. CAUSES   Direct irritation of the stomach lining. This irritation can result from increased acid production (gastroesophageal reflux disease), infection, food poisoning, taking certain medicines (such as nonsteroidal anti-inflammatory drugs), alcohol use, or tobacco use.   Signals from the brain.These signals could be caused by a headache, heat exposure, an inner ear disturbance, increased pressure in the brain from injury, infection, a tumor, or a concussion, pain, emotional stimulus, or metabolic problems.   An obstruction in the gastrointestinal tract (bowel obstruction).   Illnesses such as diabetes, hepatitis, gallbladder problems, appendicitis, kidney problems, cancer, sepsis, atypical symptoms of a heart attack, or eating disorders.   Medical treatments such as  chemotherapy and radiation.   Receiving medicine that makes you sleep (general anesthetic) during surgery.  DIAGNOSIS Your caregiver may ask for tests to be done if the problems do not improve after a few days. Tests may also be done if symptoms are severe or if the reason for the nausea and vomiting is not clear. Tests may include:  Urine tests.   Blood tests.   Stool tests.   Cultures (to look for evidence of infection).   X-rays or other imaging studies.  Test results can help your caregiver make decisions about treatment or the need for additional tests. TREATMENT You need to stay well hydrated. Drink frequently but in small amounts.You may wish to drink water, sports drinks, clear broth, or eat frozen ice pops or gelatin dessert to help stay hydrated.When you eat, eating slowly may help prevent nausea.There are also some antinausea medicines that may help prevent nausea. HOME CARE INSTRUCTIONS   Take all medicine as directed by your caregiver.   If you do not have an appetite, do not force yourself to eat. However, you must continue to drink fluids.   If you have an appetite, eat a normal diet unless your caregiver tells you differently.   Eat a variety of complex carbohydrates (rice, wheat, potatoes, bread), lean meats, yogurt, fruits, and vegetables.   Avoid high-fat foods because they are more difficult to digest.   Drink enough water and fluids to keep your urine clear or pale yellow.   If you are dehydrated, ask your caregiver for specific rehydration instructions. Signs of dehydration may include:   Severe thirst.   Dry lips and mouth.   Dizziness.   Dark urine.   Decreasing urine frequency and amount.  Confusion.   Rapid breathing or pulse.  SEEK IMMEDIATE MEDICAL CARE IF:   You have blood or brown flecks (like coffee grounds) in your vomit.   You have black or bloody stools.   You have a severe headache or stiff neck.   You are confused.   You  have severe abdominal pain.   You have chest pain or trouble breathing.   You do not urinate at least once every 8 hours.   You develop cold or clammy skin.   You continue to vomit for longer than 24 to 48 hours.   You have a fever.  MAKE SURE YOU:   Understand these instructions.   Will watch your condition.   Will get help right away if you are not doing well or get worse.  Document Released: 03/03/2005 Document Revised: 02/20/2011 Document Reviewed: 07/31/2010 [facility] Patient Information 2012 [place], [place].  Return to the clinic or go to the nearest emergency room if any of your symptoms worsen or new symptoms occur.      If you have lab work done today you will be contacted with your lab results within the next 2 weeks.  If you have not heard from [place] then please contact us. The fastest way to get your results is to register for My Chart.   IF you received an x-ray today, you will receive an invoice from [facility]. Please contact [facility] at [phone] with questions or concerns regarding your invoice.   IF you received labwork today, you will receive an invoice from [place]. Please contact [facility] at [phone] with questions or concerns regarding your invoice.   Our billing staff will not be able to assist you with questions regarding bills from these companies.  You will be contacted with the lab results as soon as they are available. The fastest way to get your results is to activate your My Chart account. Instructions are located on the last page of this paperwork. If you have not heard from [place] regarding the results in 2 weeks, please contact this office.       Signed,   Meredith Staggers, MD [facility].  04/24/18 10:38 PM

## 2018-04-21 NOTE — Patient Instructions (Addendum)
Current symptoms likely due to a virus, and vomiting today may have been the too much food, too soon.  Fluids are most important, bland foods such as soup with crackers okay initially and slowly increase as tolerated.  Zofran if needed for nausea or vomiting.  If symptoms are not improving in the next 3 to 4 days, or any worsening symptoms such as abdominal pain or fevers, return for recheck here or emergency room.  mucinex or mucinex DM as needed for cough.   Return to the clinic or go to the nearest emergency room if any of your symptoms worsen or new symptoms occur.   Gastroenteritis:  Diarrhea Infections caused by germs (bacterial) or a virus commonly cause diarrhea. Your caregiver has determined that with time, rest and fluids, the diarrhea should improve. In general, eat normally while drinking more water than usual. Although water may prevent dehydration, it does not contain salt and minerals (electrolytes). Broths, weak tea without caffeine and oral rehydration solutions (ORS) replace fluids and electrolytes. Small amounts of fluids should be taken frequently. Large amounts at one time may not be tolerated. Plain water may be harmful in infants and the elderly. Oral rehydrating solutions (ORS) are available at pharmacies and grocery stores. ORS replace water and important electrolytes in proper proportions. Sports drinks are not as effective as ORS and may be harmful due to sugars worsening diarrhea.  ORS is especially recommended for use in children with diarrhea. As a general guideline for children, replace any new fluid losses from diarrhea and/or vomiting with ORS as follows:   If your child weighs 22 pounds or under (10 kg or less), give 60-120 mL ( -  cup or 2 - 4 ounces) of ORS for each episode of diarrheal stool or vomiting episode.   If your child weighs more than 22 pounds (more than 10 kgs), give 120-240 mL ( - 1 cup or 4 - 8 ounces) of ORS for each diarrheal stool or  episode of vomiting.   While correcting for dehydration, children should eat normally. However, foods high in sugar should be avoided because this may worsen diarrhea. Large amounts of carbonated soft drinks, juice, gelatin desserts and other highly sugared drinks should be avoided.   After correction of dehydration, other liquids that are appealing to the child may be added. Children should drink small amounts of fluids frequently and fluids should be increased as tolerated. Children should drink enough fluids to keep urine clear or pale yellow.   Adults should eat normally while drinking more fluids than usual. Drink small amounts of fluids frequently and increase as tolerated. Drink enough fluids to keep urine clear or pale yellow. Broths, weak decaffeinated tea, lemon lime soft drinks (allowed to go flat) and ORS replace fluids and electrolytes.   Avoid:   Carbonated drinks.   Juice.   Extremely hot or cold fluids.   Caffeine drinks.   Fatty, greasy foods.   Alcohol.   Tobacco.   Too much intake of anything at one time.   Gelatin desserts.   Probiotics are active cultures of beneficial bacteria. They may lessen the amount and number of diarrheal stools in adults. Probiotics can be found in yogurt with active cultures and in supplements.   Wash hands well to avoid spreading bacteria and virus.   Anti-diarrheal medications are not recommended for infants and children.   Only take over-the-counter or prescription medicines for pain, discomfort or fever as directed by your caregiver. Do not give aspirin  to children because it may cause Reye's Syndrome.   For adults, ask your caregiver if you should continue all prescribed and over-the-counter medicines.   If your caregiver has given you a follow-up appointment, it is very important to keep that appointment. Not keeping the appointment could result in a chronic or permanent injury, and disability. If there is any problem keeping  the appointment, you must call back to this facility for assistance.  SEEK IMMEDIATE MEDICAL CARE IF:   You or your child is unable to keep fluids down or other symptoms or problems become worse in spite of treatment.   Vomiting or diarrhea develops and becomes persistent.   There is vomiting of blood or bile (green material).   There is blood in the stool or the stools are black and tarry.   There is no urine output in 6-8 hours or there is only a small amount of very dark urine.   Abdominal pain develops, increases or localizes.   You have a fever.   Your baby is older than 3 months with a rectal temperature of 102 F (38.9 C) or higher.   Your baby is 12 months old or younger with a rectal temperature of 100.4 F (38 C) or higher.   You or your child develops excessive weakness, dizziness, fainting or extreme thirst.   You or your child develops a rash, stiff neck, severe headache or become irritable or sleepy and difficult to awaken.  MAKE SURE YOU:   Understand these instructions.   Will watch your condition.   Will get help right away if you are not doing well or get worse.  Document Released: 02/21/2002 Document Revised: 02/20/2011 Document Reviewed: 01/08/2009 [facility] Patient Information 2012 [place], [place].  Nausea and Vomiting Nausea is a sick feeling that often comes before throwing up (vomiting). Vomiting is a reflex where stomach contents come out of your mouth. Vomiting can cause severe loss of body fluids (dehydration). Children and elderly adults can become dehydrated quickly, especially if they also have diarrhea. Nausea and vomiting are symptoms of a condition or disease. It is important to find the cause of your symptoms. CAUSES   Direct irritation of the stomach lining. This irritation can result from increased acid production (gastroesophageal reflux disease), infection, food poisoning, taking certain medicines (such as nonsteroidal anti-inflammatory  drugs), alcohol use, or tobacco use.   Signals from the brain.These signals could be caused by a headache, heat exposure, an inner ear disturbance, increased pressure in the brain from injury, infection, a tumor, or a concussion, pain, emotional stimulus, or metabolic problems.   An obstruction in the gastrointestinal tract (bowel obstruction).   Illnesses such as diabetes, hepatitis, gallbladder problems, appendicitis, kidney problems, cancer, sepsis, atypical symptoms of a heart attack, or eating disorders.   Medical treatments such as chemotherapy and radiation.   Receiving medicine that makes you sleep (general anesthetic) during surgery.  DIAGNOSIS Your caregiver may ask for tests to be done if the problems do not improve after a few days. Tests may also be done if symptoms are severe or if the reason for the nausea and vomiting is not clear. Tests may include:  Urine tests.   Blood tests.   Stool tests.   Cultures (to look for evidence of infection).   X-rays or other imaging studies.  Test results can help your caregiver make decisions about treatment or the need for additional tests. TREATMENT You need to stay well hydrated. Drink frequently but in small amounts.You  may wish to drink water, sports drinks, clear broth, or eat frozen ice pops or gelatin dessert to help stay hydrated.When you eat, eating slowly may help prevent nausea.There are also some antinausea medicines that may help prevent nausea. HOME CARE INSTRUCTIONS   Take all medicine as directed by your caregiver.   If you do not have an appetite, do not force yourself to eat. However, you must continue to drink fluids.   If you have an appetite, eat a normal diet unless your caregiver tells you differently.   Eat a variety of complex carbohydrates (rice, wheat, potatoes, bread), lean meats, yogurt, fruits, and vegetables.   Avoid high-fat foods because they are more difficult to digest.   Drink enough  water and fluids to keep your urine clear or pale yellow.   If you are dehydrated, ask your caregiver for specific rehydration instructions. Signs of dehydration may include:   Severe thirst.   Dry lips and mouth.   Dizziness.   Dark urine.   Decreasing urine frequency and amount.   Confusion.   Rapid breathing or pulse.  SEEK IMMEDIATE MEDICAL CARE IF:   You have blood or brown flecks (like coffee grounds) in your vomit.   You have black or bloody stools.   You have a severe headache or stiff neck.   You are confused.   You have severe abdominal pain.   You have chest pain or trouble breathing.   You do not urinate at least once every 8 hours.   You develop cold or clammy skin.   You continue to vomit for longer than 24 to 48 hours.   You have a fever.  MAKE SURE YOU:   Understand these instructions.   Will watch your condition.   Will get help right away if you are not doing well or get worse.  Document Released: 03/03/2005 Document Revised: 02/20/2011 Document Reviewed: 07/31/2010 [facility] Patient Information 2012 [place], [place].  Return to the clinic or go to the nearest emergency room if any of your symptoms worsen or new symptoms occur.      If you have lab work done today you will be contacted with your lab results within the next 2 weeks.  If you have not heard from [place] then please contact us. The fastest way to get your results is to register for My Chart.   IF you received an x-ray today, you will receive an invoice from [facility]. Please contact [facility] at [phone] with questions or concerns regarding your invoice.   IF you received labwork today, you will receive an invoice from [facility]. Please contact [facility] at [phone] with questions or concerns regarding your invoice.   Our billing staff will not be able to assist you with questions regarding bills from these companies.  You will be contacted with the lab  results as soon as they are available. The fastest way to get your results is to activate your My Chart account. Instructions are located on the last page of this paperwork. If you have not heard from [place] regarding the results in 2 weeks, please contact this office.

## 2018-04-24 ENCOUNTER — Encounter: Payer: Self-pay | Admitting: Family Medicine
